# Patient Record
Sex: Female | Born: 2005 | Race: White | Hispanic: No | Marital: Single | Smoking: Never smoker
Health system: Southern US, Community
[De-identification: ages and names within clinical notes are randomized; demographics above are authoritative.]

## PROBLEM LIST (undated history)

## (undated) DIAGNOSIS — F419 Anxiety disorder, unspecified: Secondary | ICD-10-CM

## (undated) DIAGNOSIS — F32A Depression, unspecified: Secondary | ICD-10-CM

---
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
## 2023-03-18 ENCOUNTER — Other Ambulatory Visit (HOSPITAL_COMMUNITY)
Admission: RE | Admit: 2023-03-18 | Discharge: 2023-03-18 | Disposition: A | Discharge status: Home / Self Care | Payer: Medicaid Other | Source: Ambulatory Visit | Attending: Obstetrics and Gynecology | Admitting: Obstetrics and Gynecology

## 2023-03-18 ENCOUNTER — Encounter: Payer: Self-pay | Admitting: Obstetrics and Gynecology

## 2023-03-18 ENCOUNTER — Ambulatory Visit: Payer: Medicaid Other | Admitting: Obstetrics and Gynecology

## 2023-03-18 VITALS — BP 106/68 | HR 67 | Ht 66.0 in | Wt 116.0 lb

## 2023-03-18 DIAGNOSIS — Z1339 Encounter for screening examination for other mental health and behavioral disorders: Secondary | ICD-10-CM | POA: Diagnosis not present

## 2023-03-18 DIAGNOSIS — Z30430 Encounter for insertion of intrauterine contraceptive device: Secondary | ICD-10-CM | POA: Diagnosis not present

## 2023-03-18 DIAGNOSIS — Z3202 Encounter for pregnancy test, result negative: Secondary | ICD-10-CM | POA: Diagnosis not present

## 2023-03-18 LAB — POCT URINE PREGNANCY: Preg Test, Ur: NEGATIVE

## 2023-03-18 MED ORDER — LEVONORGESTREL 20 MCG/DAY IU IUD
1.0000 | INTRAUTERINE_SYSTEM | Freq: Once | INTRAUTERINE | Status: AC
  Administered 2023-03-18: 1 via INTRAUTERINE

## 2023-03-18 NOTE — Progress Notes (Signed)
    GYNECOLOGY OFFICE PROCEDURE NOTE  Kendra Banks is a 17 y.o. G0 here for IUD insertion. No GYN concerns.   IUD Insertion Procedure Note Procedure: IUD insertion with Mirena UPT: Negative GC/CT testing: Offered and accepts  Patient identified.  Risks, benefits and alternatives of procedure were discussed including irregular bleeding, cramping, infection, malpositioning or misplacement of the IUD outside the uterus which may require further procedure such as laparoscopy. Also discussed >99% contraception efficacy, increased risk of ectopic pregnancy with failure of method.   Emphasized that this did not protect against STIs, condoms recommended during all sexual encounters. Consent signed. Time out performed.   Speculum inserted and cervix visualized, prepped with 3 swabs of betadine. Intracervical block with 5cc of 1% lidocaine administered.   Grasped with a single tooth tenaculum. , Uterus sounded to 8 cm. , and IUD then inserted without difficulty per manufacturer's instructions and strings cut to 3 cm below cervical os and all instruments removed. Pt tolerated well with minimal pain and bleeding.   Discussed concerning signs/symptoms and to call if heavy bleeding, severe abdominal pain, or fever in the following 3 weeks. Manufacturer pamphlet/patient information given. Reviewed timing of efficacy for contraception and to use an alternative form of birth control until that time.   Harvie Bridge, MD Obstetrician & Gynecologist, [facility] for [employer], [facility]

## 2023-03-18 NOTE — Patient Instructions (Addendum)
It was nice meeting you today. If you have the MyChart app, you will see your test results within 1 week.   Please make an appointment within 6 months to make sure you're up to date on preventative care and get testing for HIV, syphilis and hepatitis

## 2023-03-18 NOTE — Progress Notes (Addendum)
17 y.o. New GYN presents for AEX/IUD Insertion.  Pt currently uses condoms for [facility].  UPT today is Negative.  Administrations This Visit     levonorgestrel (MIRENA) 20 MCG/DAY IUD 1 each     Admin Date 03/18/2023 Action Given Dose 1 each Route Intrauterine Administered By Maretta Bees, RMA

## 2023-03-19 LAB — CERVICOVAGINAL ANCILLARY ONLY
Chlamydia: NEGATIVE
Comment: NEGATIVE
Comment: NEGATIVE
Comment: NORMAL
Neisseria Gonorrhea: NEGATIVE
Trichomonas: NEGATIVE

## 2023-03-23 ENCOUNTER — Encounter: Payer: Self-pay | Admitting: *Deleted

## 2023-03-23 NOTE — Progress Notes (Signed)
Letter printed and mailed to pt address on file.

## 2023-03-30 IMAGING — DX DG SCOLIOSIS EVAL COMPLETE SPINE 1V
1 series · 2 of 2 positions shown · non-contrast
Comparison: None.

CLINICAL DATA: Encounter for routine child health examination
without abnormal findings

EXAM:
DG SCOLIOSIS EVAL COMPLETE SPINE 1V

[Series 1: dg scoliosis ap · U · 0.38mm/px · 2 of 2 slices shown]
[im 1/2]
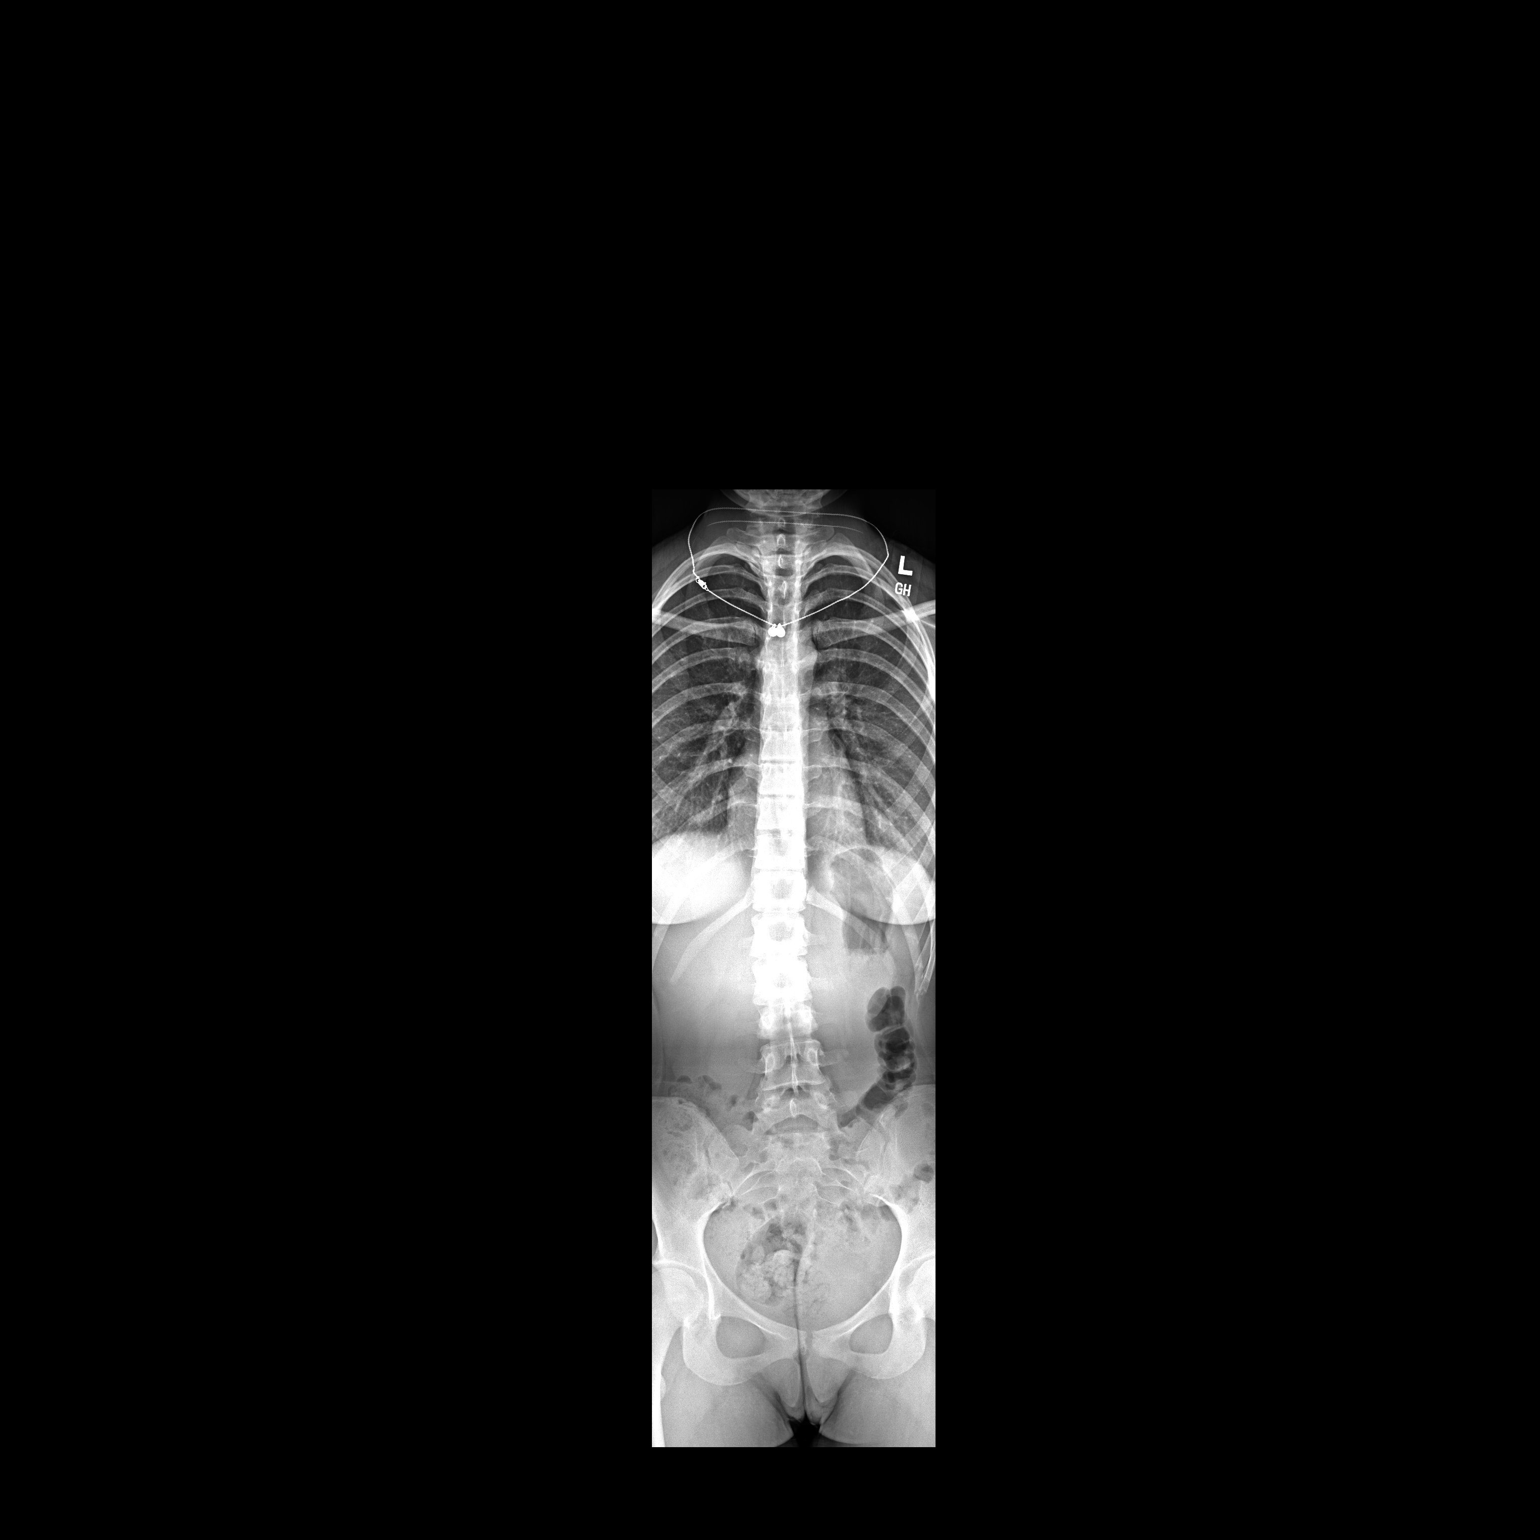
[im 2/2]
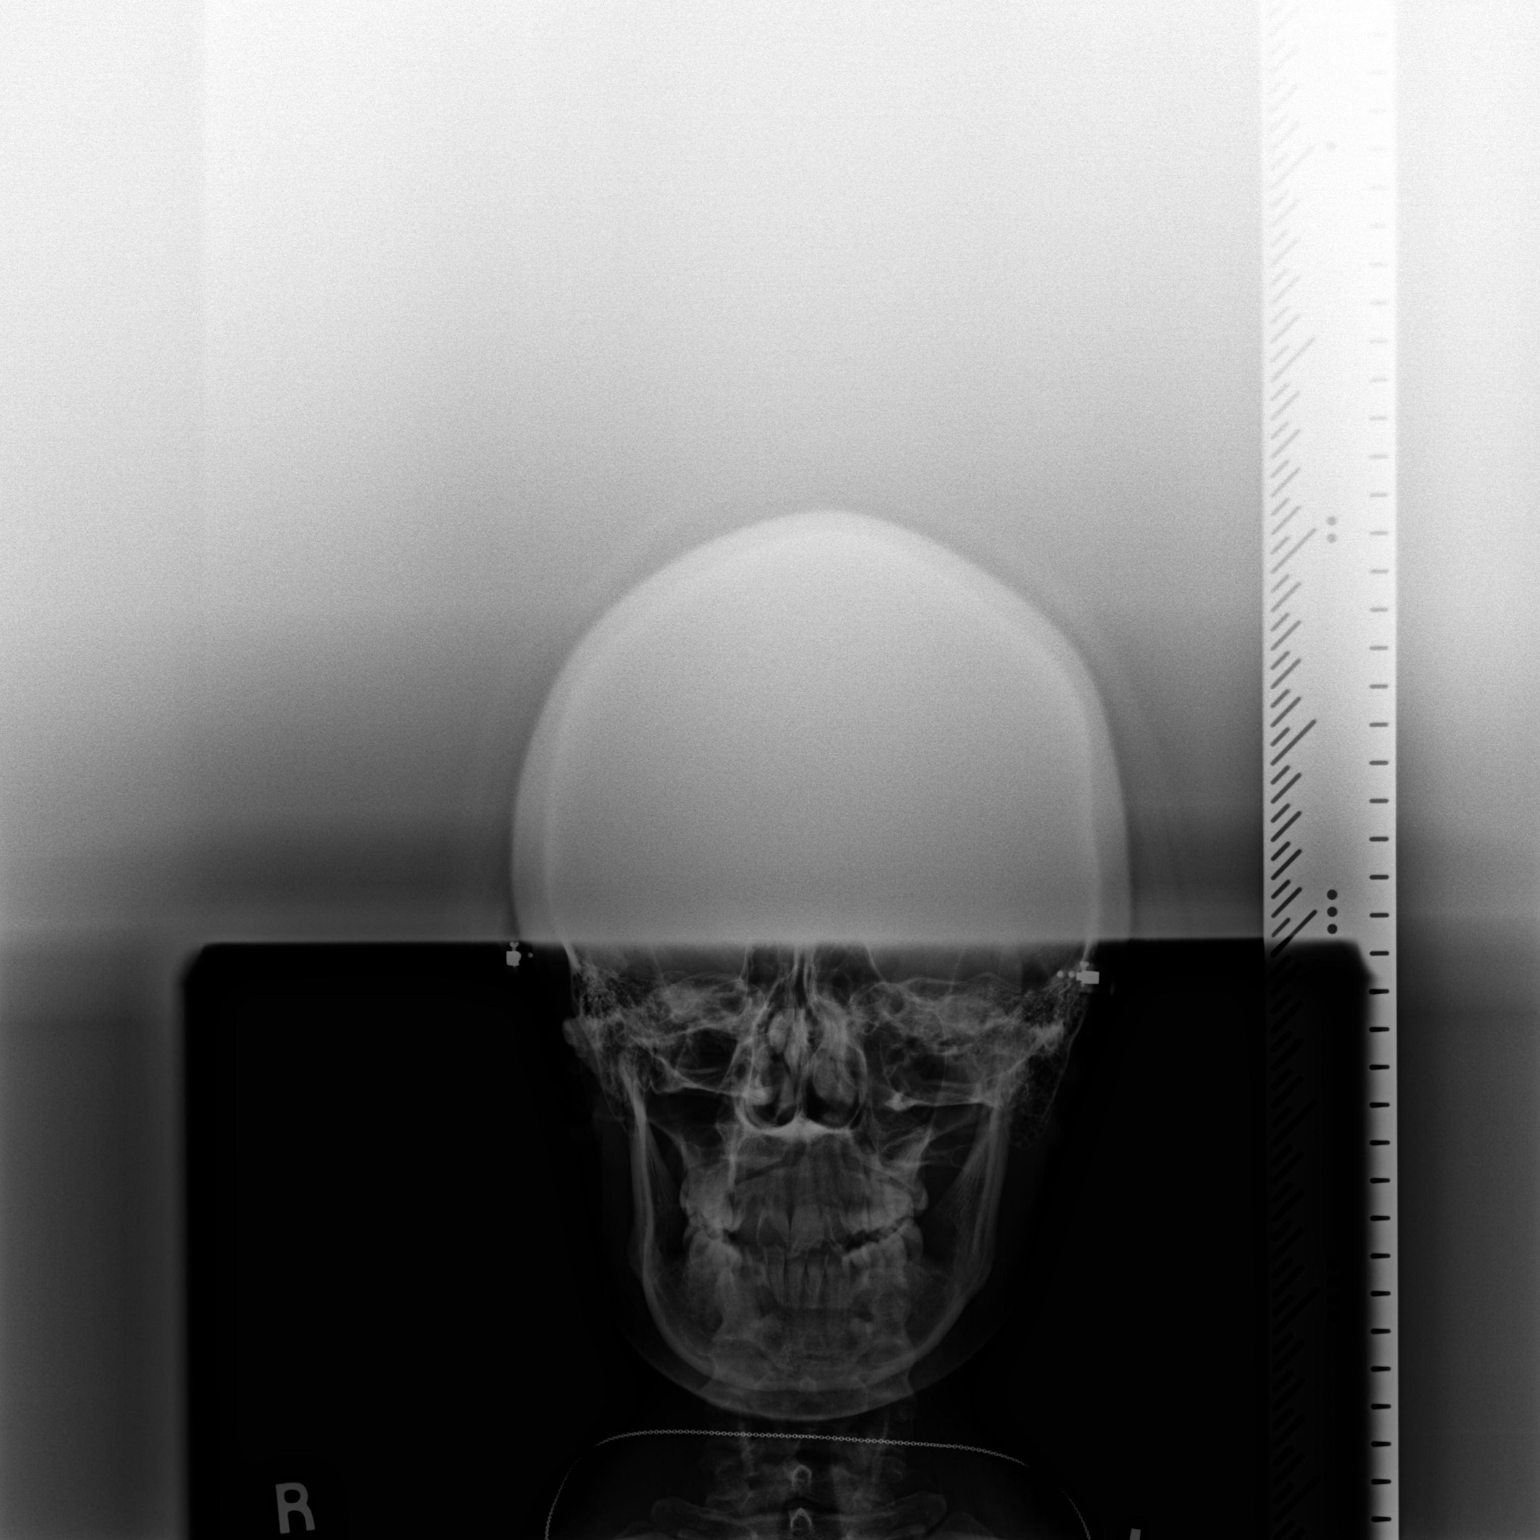

[2 of 2 positions shown; findings below may reference images not displayed]

FINDINGS: There are 12 rib-bearing thoracic type and 5 lumbar type vertebral
bodies. There is a mild convex right scoliosis centered at T12-L1
and measuring approximately 9 degrees. No vertebral anomaly, acute
osseous findings or paraspinal abnormality identified.
IMPRESSION: Mild convex right thoracolumbar scoliosis.
